# Patient Record
Sex: Male | Born: 1940 | Race: Black or African American | Hispanic: No | Marital: Single
Health system: Southern US, Community
[De-identification: ages and names within clinical notes are randomized; demographics above are authoritative.]

---

## 2011-11-12 ENCOUNTER — Emergency Department: Payer: Self-pay | Admitting: Emergency Medicine

## 2011-11-12 LAB — COMPREHENSIVE METABOLIC PANEL
Albumin: 3.7 g/dL (ref 3.4–5.0)
Alkaline Phosphatase: 74 U/L (ref 50–136)
Bilirubin,Total: 0.5 mg/dL (ref 0.2–1.0)
Chloride: 104 mmol/L (ref 98–107)
Co2: 29 mmol/L (ref 21–32)
EGFR (African American): >60
EGFR (Non-African Amer.): >60
Glucose: 93 mg/dL (ref 65–99)
Potassium: 3.7 mmol/L (ref 3.5–5.1)
Sodium: 142 mmol/L (ref 136–145)

## 2011-11-12 LAB — CBC
HCT: 40.9 % (ref 40.0–52.0)
HGB: 13.2 g/dL (ref 13.0–18.0)
MCH: 26.6 pg (ref 26.0–34.0)
Platelet: 224 10*3/uL (ref 150–440)
RBC: 4.98 10*6/uL (ref 4.40–5.90)
RDW: 16.6 % — ABNORMAL HIGH (ref 11.5–14.5)

## 2011-11-12 LAB — TSH: Thyroid Stimulating Horm: 2.95 u[IU]/mL

## 2011-11-30 ENCOUNTER — Emergency Department: Payer: Self-pay | Admitting: Emergency Medicine

## 2011-11-30 LAB — COMPREHENSIVE METABOLIC PANEL
Alkaline Phosphatase: 72 U/L (ref 50–136)
Bilirubin,Total: 0.6 mg/dL (ref 0.2–1.0)
Calcium, Total: 8.6 mg/dL (ref 8.5–10.1)
Co2: 28 mmol/L (ref 21–32)
EGFR (African American): >60
EGFR (Non-African Amer.): >60
Glucose: 128 mg/dL — ABNORMAL HIGH (ref 65–99)
Osmolality: 285 (ref 275–301)
Potassium: 3.4 mmol/L — ABNORMAL LOW (ref 3.5–5.1)
SGOT(AST): 28 U/L (ref 15–37)
Sodium: 141 mmol/L (ref 136–145)
Total Protein: 6.7 g/dL (ref 6.4–8.2)

## 2011-11-30 LAB — ETHANOL
Ethanol %: <0.003 % (ref 0.000–0.080)
Ethanol: <3 mg/dL

## 2011-11-30 LAB — SALICYLATE LEVEL: Salicylates, Serum: <1.7 mg/dL

## 2011-11-30 LAB — CBC
HCT: 37.7 % — ABNORMAL LOW (ref 40.0–52.0)
MCV: 82 fL (ref 80–100)
RBC: 4.6 10*6/uL (ref 4.40–5.90)

## 2011-11-30 LAB — TSH: Thyroid Stimulating Horm: 3.11 u[IU]/mL

## 2011-11-30 LAB — ACETAMINOPHEN LEVEL: Acetaminophen: <2 ug/mL

## 2011-12-01 LAB — DRUG SCREEN, URINE
Barbiturates, Ur Screen: NEGATIVE (ref ?–200)
Benzodiazepine, Ur Scrn: POSITIVE (ref ?–200)
Cannabinoid 50 Ng, Ur ~~LOC~~: NEGATIVE (ref ?–50)
Cocaine Metabolite,Ur ~~LOC~~: NEGATIVE (ref ?–300)
MDMA (Ecstasy)Ur Screen: NEGATIVE (ref ?–500)
Methadone, Ur Screen: NEGATIVE (ref ?–300)
Opiate, Ur Screen: NEGATIVE (ref ?–300)
Phencyclidine (PCP) Ur S: NEGATIVE (ref ?–25)
Tricyclic, Ur Screen: NEGATIVE (ref ?–1000)

## 2011-12-01 LAB — TROPONIN I: Troponin-I: <0.02 ng/mL

## 2011-12-01 LAB — MAGNESIUM: Magnesium: 1.9 mg/dL

## 2012-01-08 ENCOUNTER — Emergency Department: Payer: Self-pay | Admitting: Emergency Medicine

## 2012-01-08 LAB — URINALYSIS, COMPLETE
Bacteria: NONE SEEN
Bilirubin,UR: NEGATIVE
Blood: NEGATIVE
Glucose,UR: NEGATIVE mg/dL (ref 0–75)
Ketone: NEGATIVE
Leukocyte Esterase: NEGATIVE
Specific Gravity: 1.011 (ref 1.003–1.030)
Squamous Epithelial: 1
WBC UR: <1 /HPF (ref 0–5)

## 2012-01-08 LAB — DRUG SCREEN, URINE
Amphetamines, Ur Screen: NEGATIVE (ref ?–1000)
Barbiturates, Ur Screen: NEGATIVE (ref ?–200)
Benzodiazepine, Ur Scrn: NEGATIVE (ref ?–200)
MDMA (Ecstasy)Ur Screen: NEGATIVE (ref ?–500)
Phencyclidine (PCP) Ur S: NEGATIVE (ref ?–25)
Tricyclic, Ur Screen: NEGATIVE (ref ?–1000)

## 2012-01-08 LAB — COMPREHENSIVE METABOLIC PANEL
Albumin: 3.1 g/dL — ABNORMAL LOW (ref 3.4–5.0)
Alkaline Phosphatase: 72 U/L (ref 50–136)
Anion Gap: 6 — ABNORMAL LOW (ref 7–16)
BUN: 18 mg/dL (ref 7–18)
Calcium, Total: 8.6 mg/dL (ref 8.5–10.1)
Chloride: 104 mmol/L (ref 98–107)
Creatinine: 1.15 mg/dL (ref 0.60–1.30)
EGFR (Non-African Amer.): >60
Glucose: 103 mg/dL — ABNORMAL HIGH (ref 65–99)
Potassium: 3.8 mmol/L (ref 3.5–5.1)
SGOT(AST): 26 U/L (ref 15–37)
SGPT (ALT): 23 U/L
Total Protein: 6.5 g/dL (ref 6.4–8.2)

## 2012-01-08 LAB — ACETAMINOPHEN LEVEL: Acetaminophen: <2 ug/mL

## 2012-01-08 LAB — ETHANOL
Ethanol %: <0.003 % (ref 0.000–0.080)
Ethanol: <3 mg/dL

## 2012-01-08 LAB — TSH: Thyroid Stimulating Horm: 1.71 u[IU]/mL

## 2012-01-08 LAB — CBC
MCHC: 33.5 g/dL (ref 32.0–36.0)
MCV: 81 fL (ref 80–100)
RDW: 17 % — ABNORMAL HIGH (ref 11.5–14.5)

## 2012-01-09 LAB — VALPROIC ACID LEVEL: Valproic Acid: 46 ug/mL — ABNORMAL LOW

## 2012-01-14 ENCOUNTER — Emergency Department: Payer: Self-pay | Admitting: Emergency Medicine

## 2012-01-14 LAB — CBC
HGB: 13.8 g/dL (ref 13.0–18.0)
MCH: 27 pg (ref 26.0–34.0)
MCHC: 33.2 g/dL (ref 32.0–36.0)
Platelet: 239 10*3/uL (ref 150–440)
RDW: 17 % — ABNORMAL HIGH (ref 11.5–14.5)

## 2012-01-14 LAB — DRUG SCREEN, URINE
Amphetamines, Ur Screen: NEGATIVE (ref ?–1000)
Cocaine Metabolite,Ur ~~LOC~~: NEGATIVE (ref ?–300)
MDMA (Ecstasy)Ur Screen: NEGATIVE (ref ?–500)
Methadone, Ur Screen: NEGATIVE (ref ?–300)
Tricyclic, Ur Screen: NEGATIVE (ref ?–1000)

## 2012-01-14 LAB — COMPREHENSIVE METABOLIC PANEL
Albumin: 3.5 g/dL (ref 3.4–5.0)
BUN: 15 mg/dL (ref 7–18)
Calcium, Total: 9.1 mg/dL (ref 8.5–10.1)
EGFR (African American): >60
EGFR (Non-African Amer.): >60
Glucose: 84 mg/dL (ref 65–99)
Osmolality: 285 (ref 275–301)
Potassium: 4 mmol/L (ref 3.5–5.1)
SGOT(AST): 33 U/L (ref 15–37)
SGPT (ALT): 20 U/L (ref 12–78)
Sodium: 143 mmol/L (ref 136–145)

## 2012-01-14 LAB — ETHANOL: Ethanol: <3 mg/dL

## 2013-08-24 IMAGING — CR DG CHEST 2V
1 series · 2 of 2 positions shown · non-contrast
Comparison: none

REASON FOR EXAM: mental health eval
COMMENTS:

PROCEDURE:     DXR - DXR CHEST PA (OR AP) AND LATERAL  - January 08, 2012  [DATE]
RESULT:     Comparison: None

[Series 2: x chest ap · 0.14mm/px · 2 of 2 slices shown]
[im 1/2]
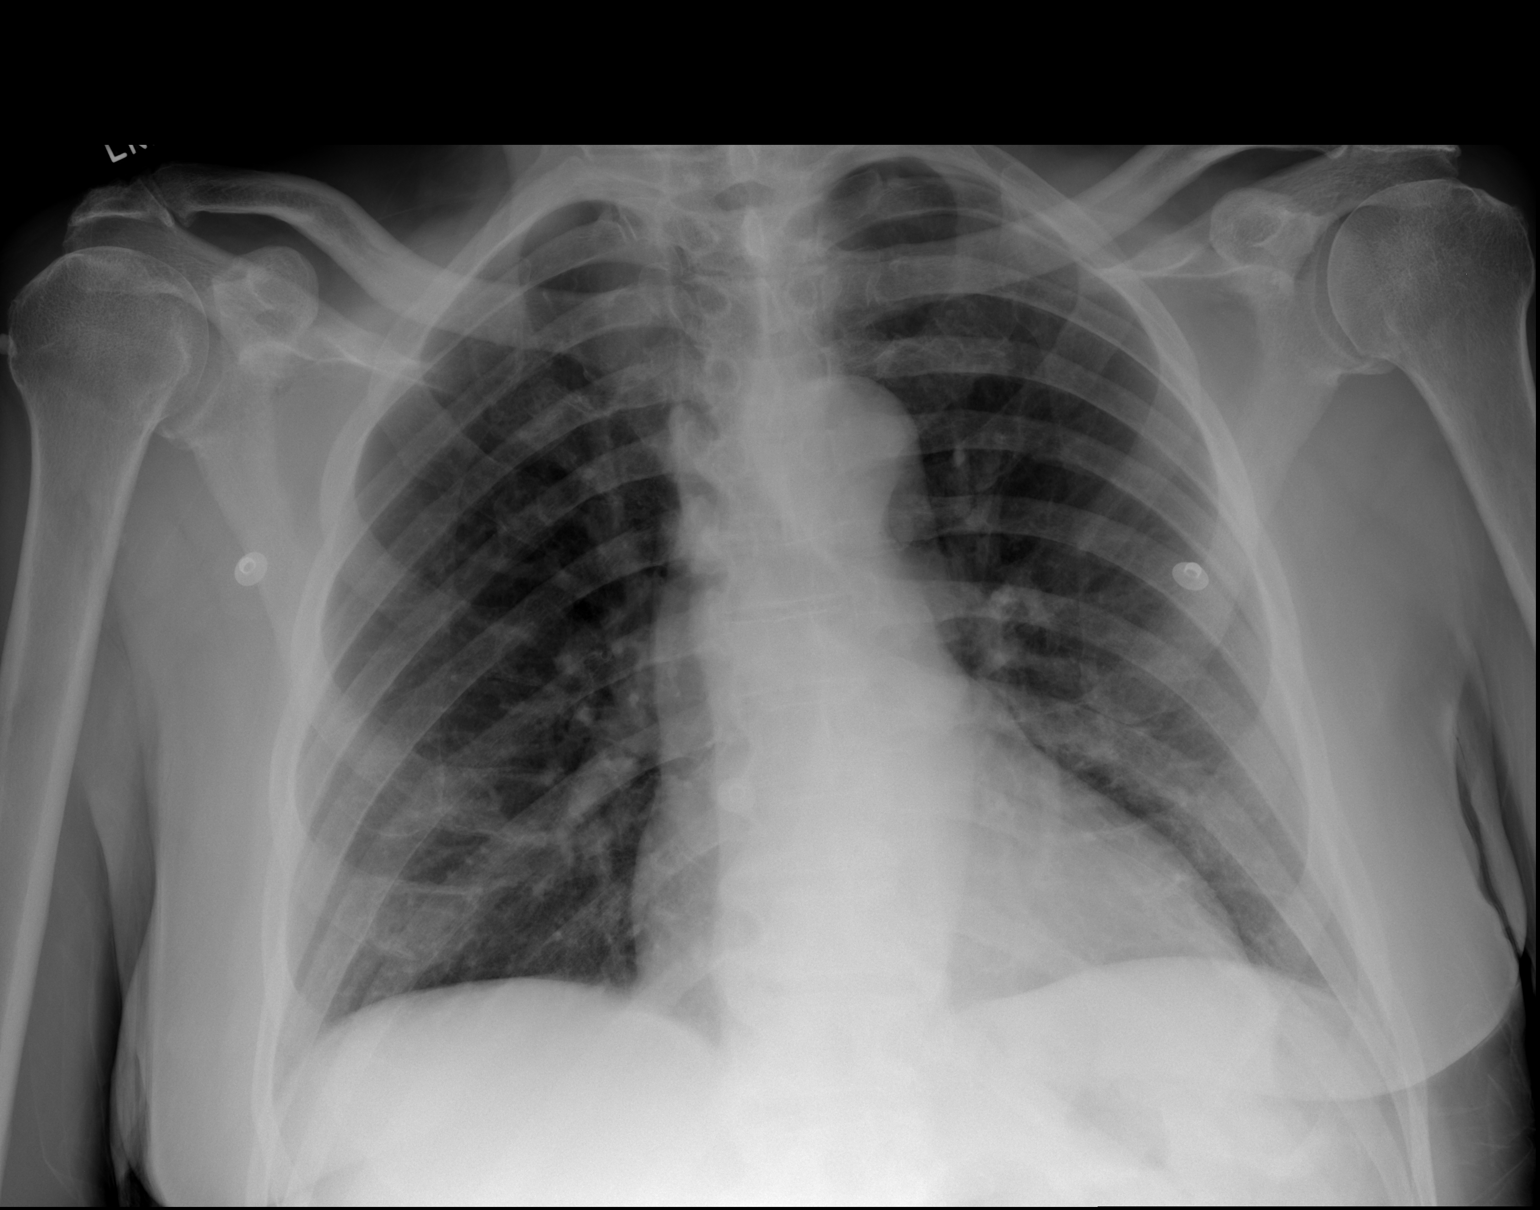
[im 2/2]
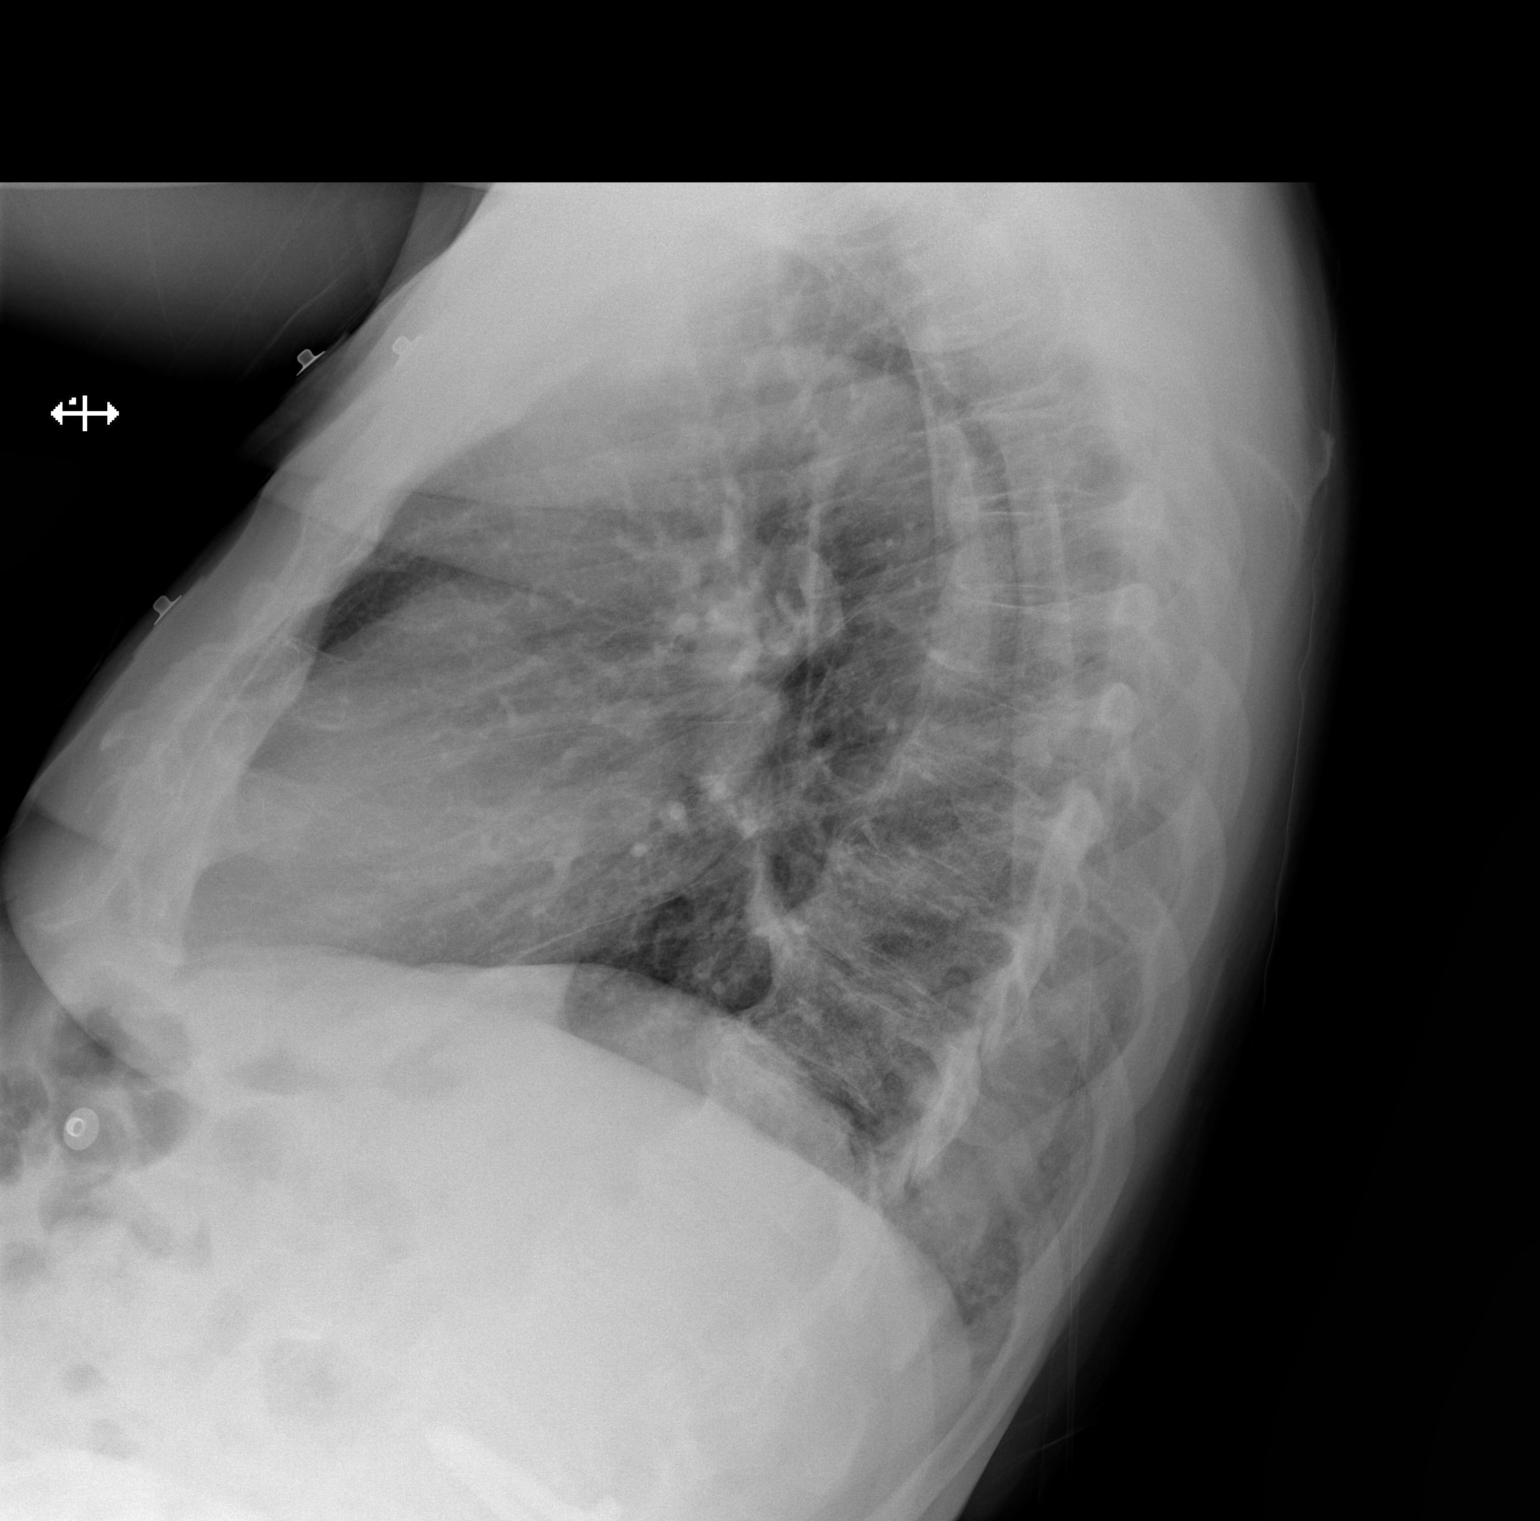

[2 of 2 positions shown; findings below may reference images not displayed]

FINDINGS: PA and lateral chest radiographs are provided.  There is no focal
parenchymal opacity, pleural effusion, or pneumothorax. The heart and
mediastinum are unremarkable.  The osseous structures are unremarkable.
IMPRESSION: No acute disease of the che[REDACTED]

## 2013-08-31 IMAGING — CT CT HEAD WITHOUT CONTRAST
2 series · 16 of 30 positions shown, 20 images · non-contrast
Comparison: none

REASON FOR EXAM: difficulty with words, mumbeling. Wife states this is
new behavior.
COMMENTS:

PROCEDURE:     CT  - CT HEAD WITHOUT CONTRAST  - January 15, 2012  [DATE]
RESULT:     History: Abnormal gait.

[Series 2: without · axial · non-contrast · 0.43mm/px · z∈[-129,-4]mm · 13 of 31 slices shown, 17 images]
[im 3/31  brain]
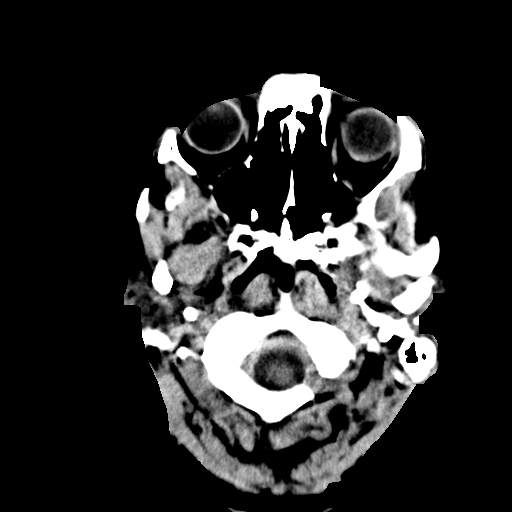
[im 3/31  bone]
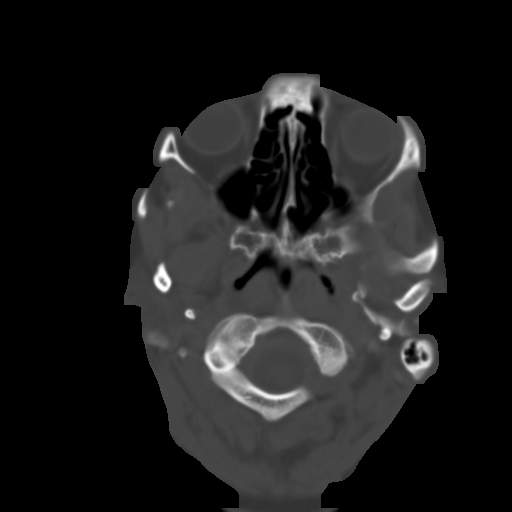
[im 5/31  brain]
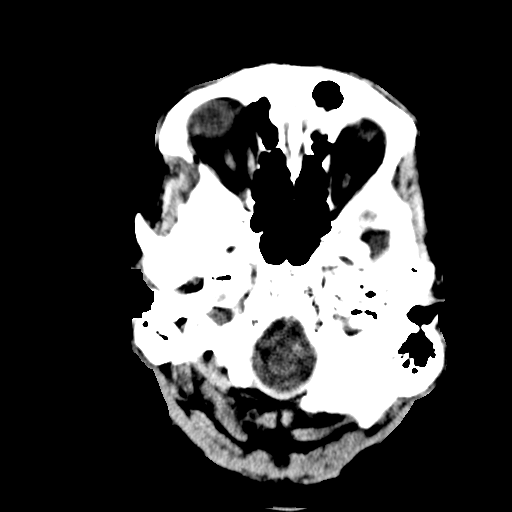
[im 7/31  brain]
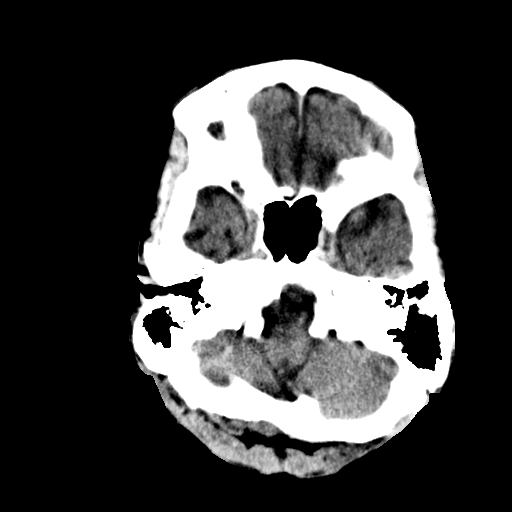
[im 9/31  brain]
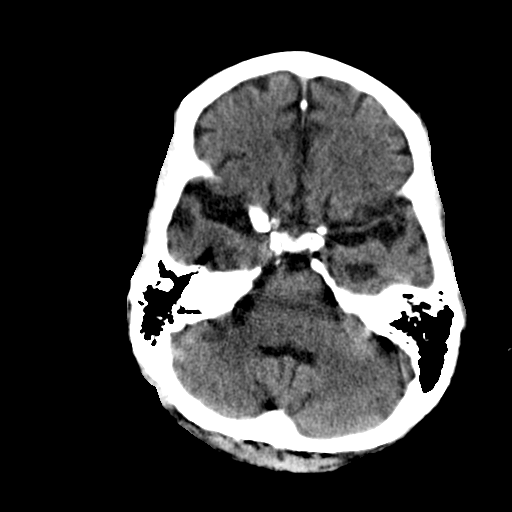
[im 11/31  brain]
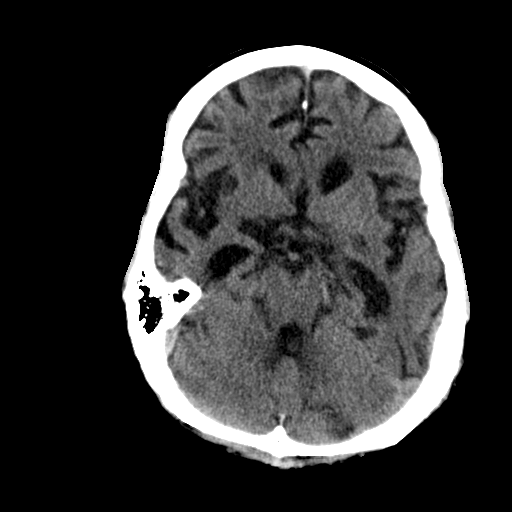
[im 11/31  bone]
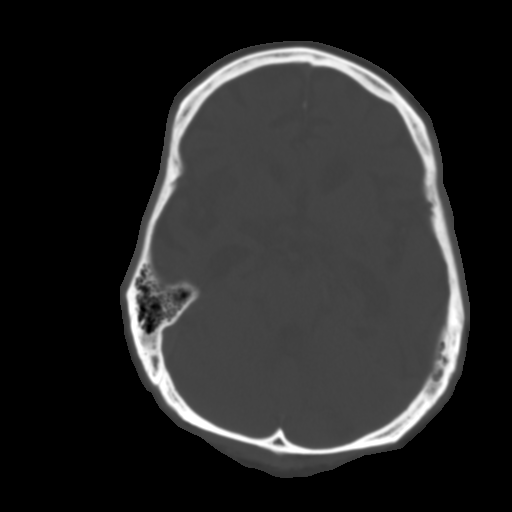
[im 13/31  brain]
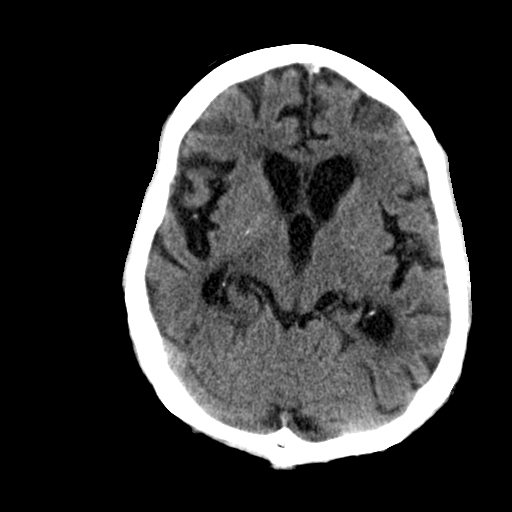
[im 16/31  brain]
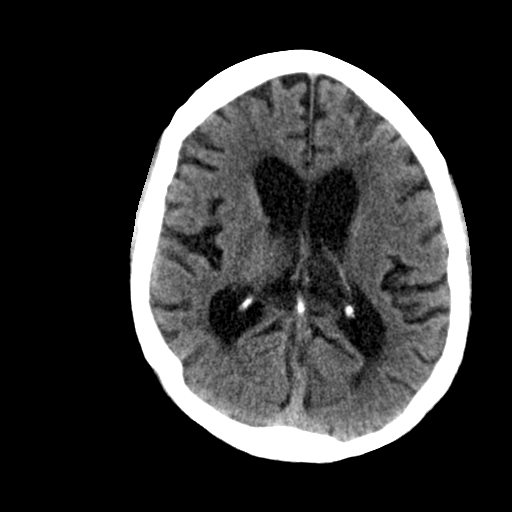
[im 18/31  brain]
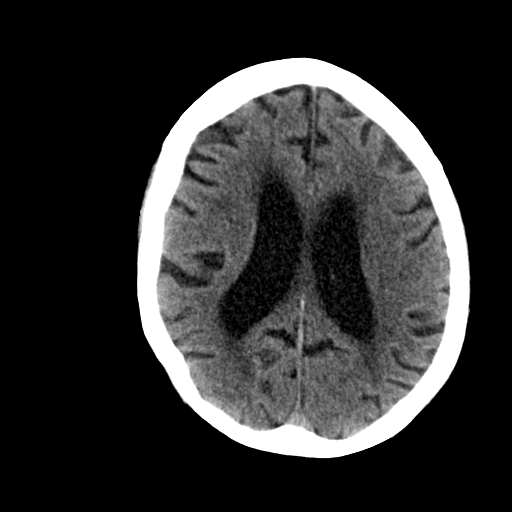
[im 20/31  brain]
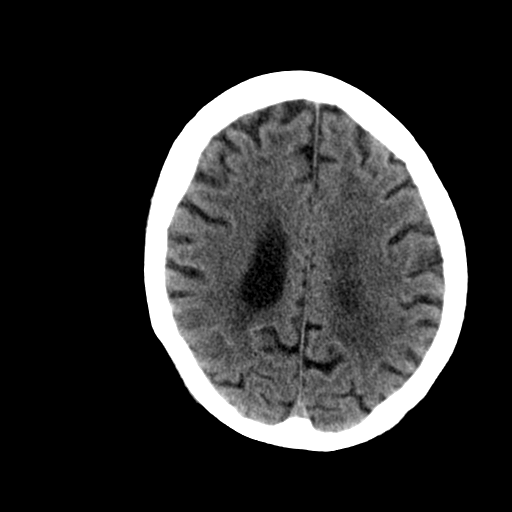
[im 20/31  bone]
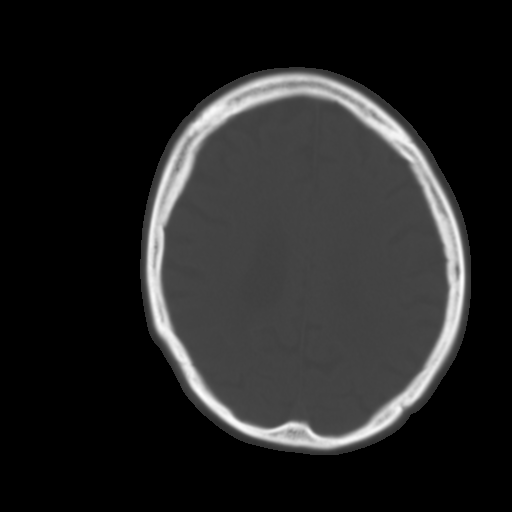
[im 22/31  brain]
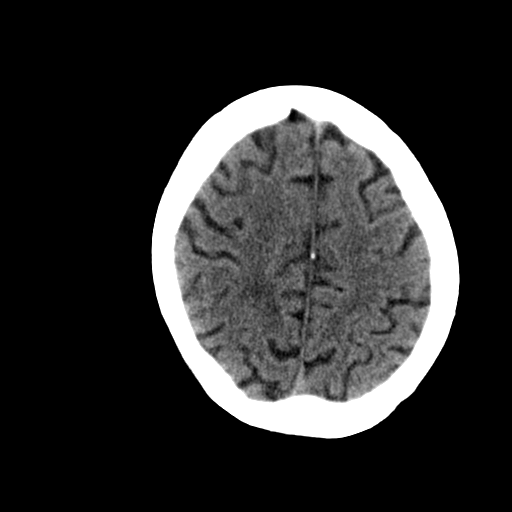
[im 24/31  brain]
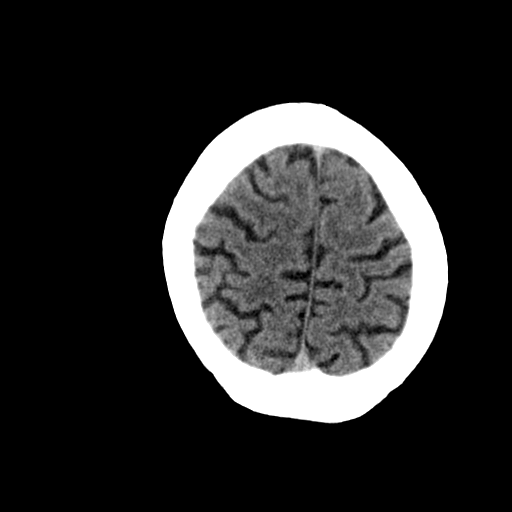
[im 26/31  brain]
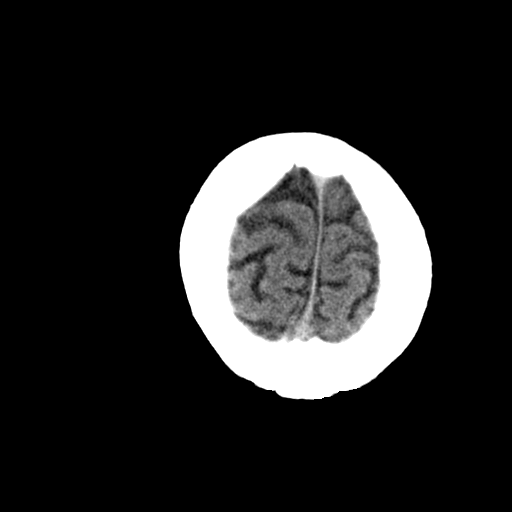
[im 28/31  brain]
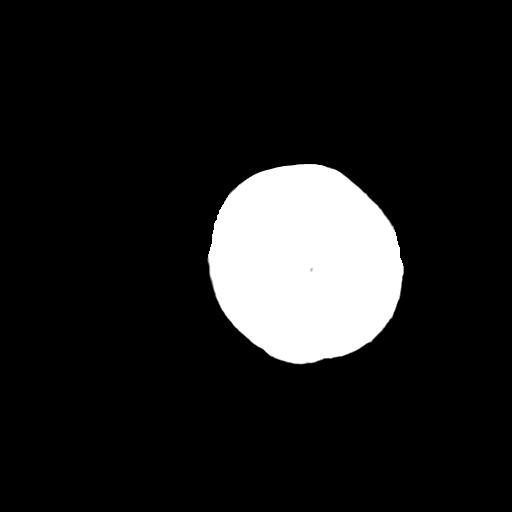
[im 28/31  bone]
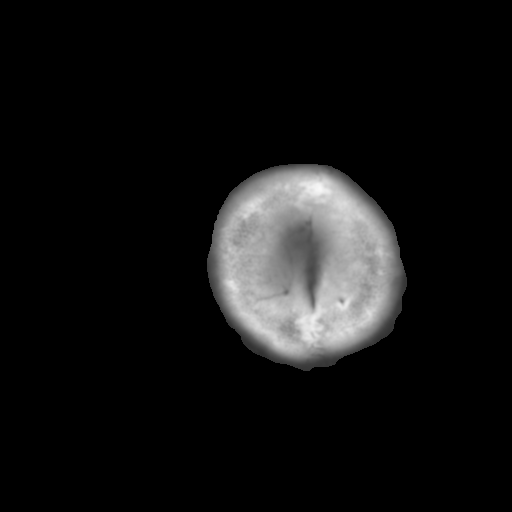

[Series 3: bone · axial · 0.43mm/px · z∈[-129,-89]mm · 3 of 31 slices shown]
[im 3/31  bone]
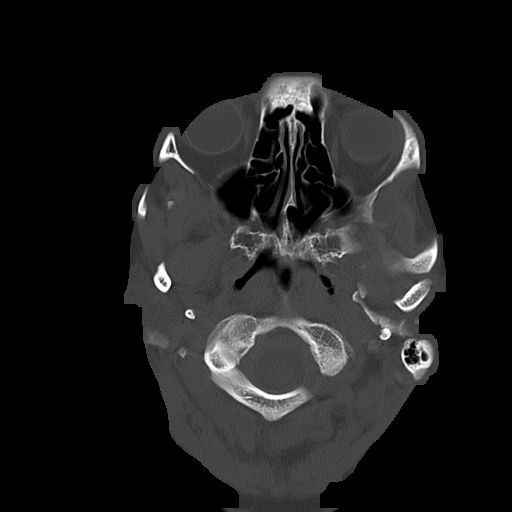
[im 7/31  bone]
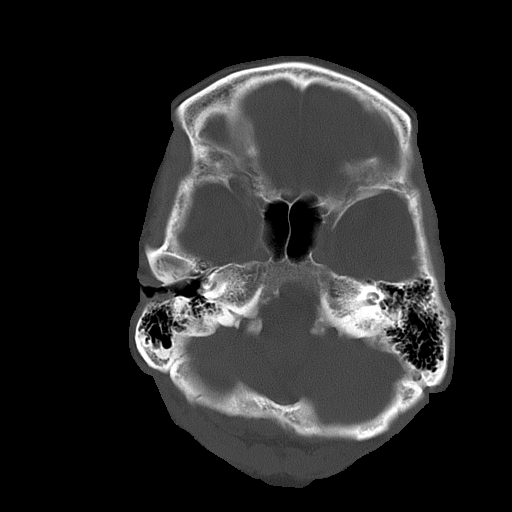
[im 11/31  bone]
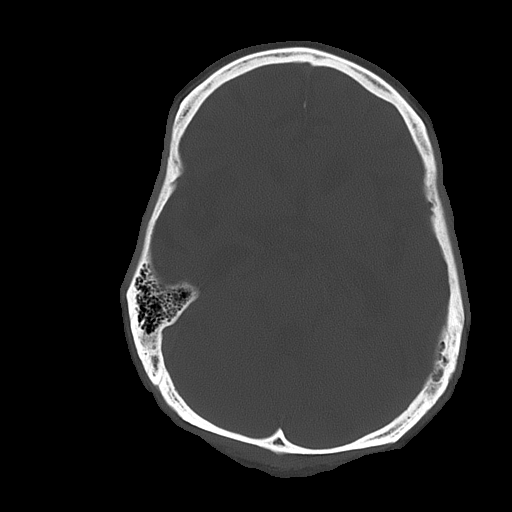

[16 of 30 positions shown; findings below may reference images not displayed]

FINDINGS: Standard nonenhanced CT obtained. Diffuse atrophy present. White
matter changes consistent with chronic ischemia. Basal ganglia calcification
present. No acute bony abnormality.
IMPRESSION: Diffuse atrophy and white matter changes consistent chronic
ischemia.
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
## 2018-05-10 DEATH — deceased

## 2023-01-09 DEATH — deceased
